# Patient Record
Sex: Female | Born: 1955 | Race: White | Hispanic: No | Smoking: Former smoker
Health system: Southern US, Community
[De-identification: ages and names within clinical notes are randomized; demographics above are authoritative.]

## PROBLEM LIST (undated history)

## (undated) DIAGNOSIS — E785 Hyperlipidemia, unspecified: Secondary | ICD-10-CM

## (undated) DIAGNOSIS — I10 Essential (primary) hypertension: Secondary | ICD-10-CM

## (undated) DIAGNOSIS — N189 Chronic kidney disease, unspecified: Secondary | ICD-10-CM

## (undated) HISTORY — DX: Hyperlipidemia, unspecified: E78.5

## (undated) HISTORY — PX: RIGHT OOPHORECTOMY: SHX2359

## (undated) HISTORY — PX: DILATION AND CURETTAGE OF UTERUS: SHX78

## (undated) HISTORY — DX: Chronic kidney disease, unspecified: N18.9

## (undated) HISTORY — DX: Essential (primary) hypertension: I10

---

## 1999-01-21 ENCOUNTER — Other Ambulatory Visit: Admission: RE | Admit: 1999-01-21 | Discharge: 1999-01-21 | Payer: Self-pay | Admitting: Obstetrics and Gynecology

## 1999-04-02 ENCOUNTER — Encounter: Admission: RE | Admit: 1999-04-02 | Discharge: 1999-04-02 | Payer: Self-pay | Admitting: *Deleted

## 1999-04-02 ENCOUNTER — Encounter: Payer: Self-pay | Admitting: *Deleted

## 2001-01-24 ENCOUNTER — Encounter: Payer: Self-pay | Admitting: Obstetrics and Gynecology

## 2001-01-24 ENCOUNTER — Encounter: Admission: RE | Admit: 2001-01-24 | Discharge: 2001-01-24 | Payer: Self-pay | Admitting: Obstetrics and Gynecology

## 2002-02-14 ENCOUNTER — Encounter: Payer: Self-pay | Admitting: Obstetrics and Gynecology

## 2002-02-14 ENCOUNTER — Encounter: Admission: RE | Admit: 2002-02-14 | Discharge: 2002-02-14 | Payer: Self-pay | Admitting: Obstetrics and Gynecology

## 2003-02-16 ENCOUNTER — Encounter: Payer: Self-pay | Admitting: Obstetrics and Gynecology

## 2003-02-16 ENCOUNTER — Encounter: Admission: RE | Admit: 2003-02-16 | Discharge: 2003-02-16 | Payer: Self-pay | Admitting: Obstetrics and Gynecology

## 2003-06-27 ENCOUNTER — Ambulatory Visit (HOSPITAL_COMMUNITY): Admission: RE | Admit: 2003-06-27 | Discharge: 2003-06-27 | Payer: Self-pay | Admitting: Family Medicine

## 2004-11-19 ENCOUNTER — Ambulatory Visit (HOSPITAL_COMMUNITY): Admission: RE | Admit: 2004-11-19 | Discharge: 2004-11-19 | Payer: Self-pay | Admitting: Obstetrics and Gynecology

## 2005-02-20 IMAGING — CR DG CHEST 2V
2 series · 2 of 2 positions shown · non-contrast
Comparison: None.

CLINICAL DATA: Cough and fever.  Posterior and lower chest pain and shortness of breath.  

 TWO VIEW CHEST, 06/27/03

[view not recorded (1 of 2)]
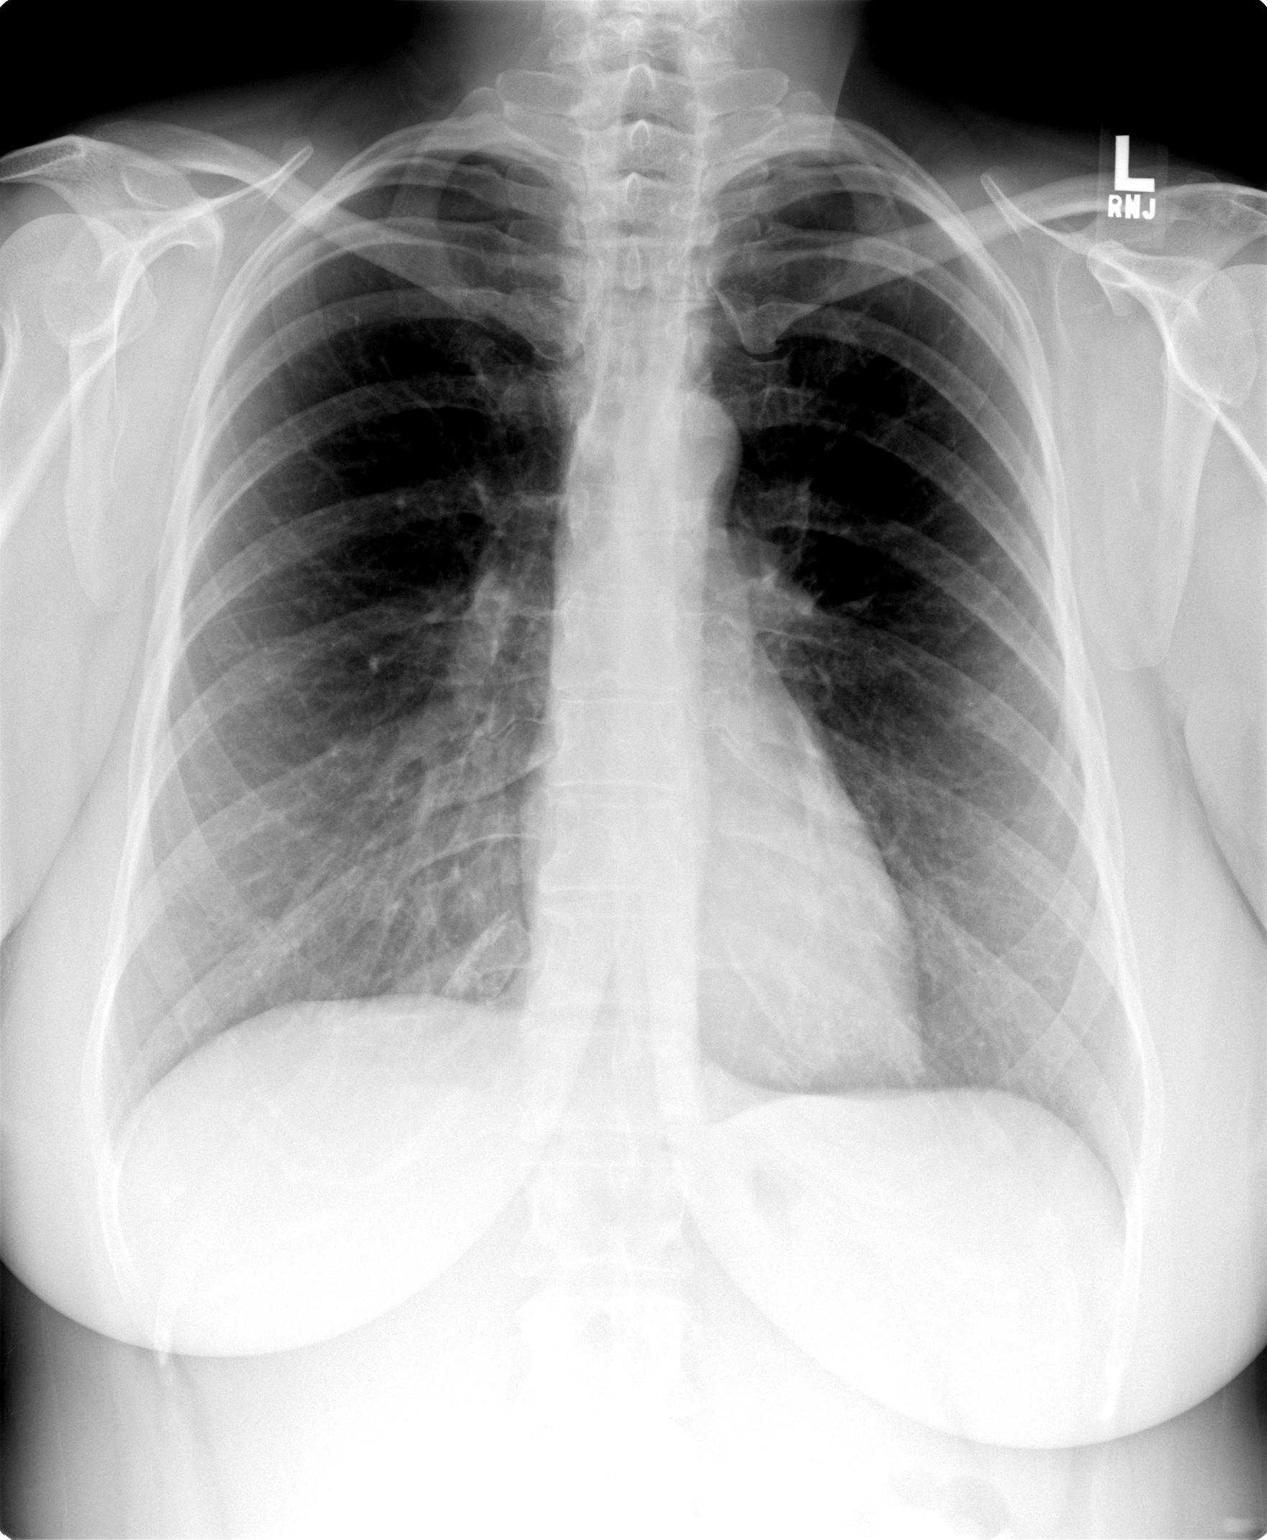

[view not recorded (2 of 2)]
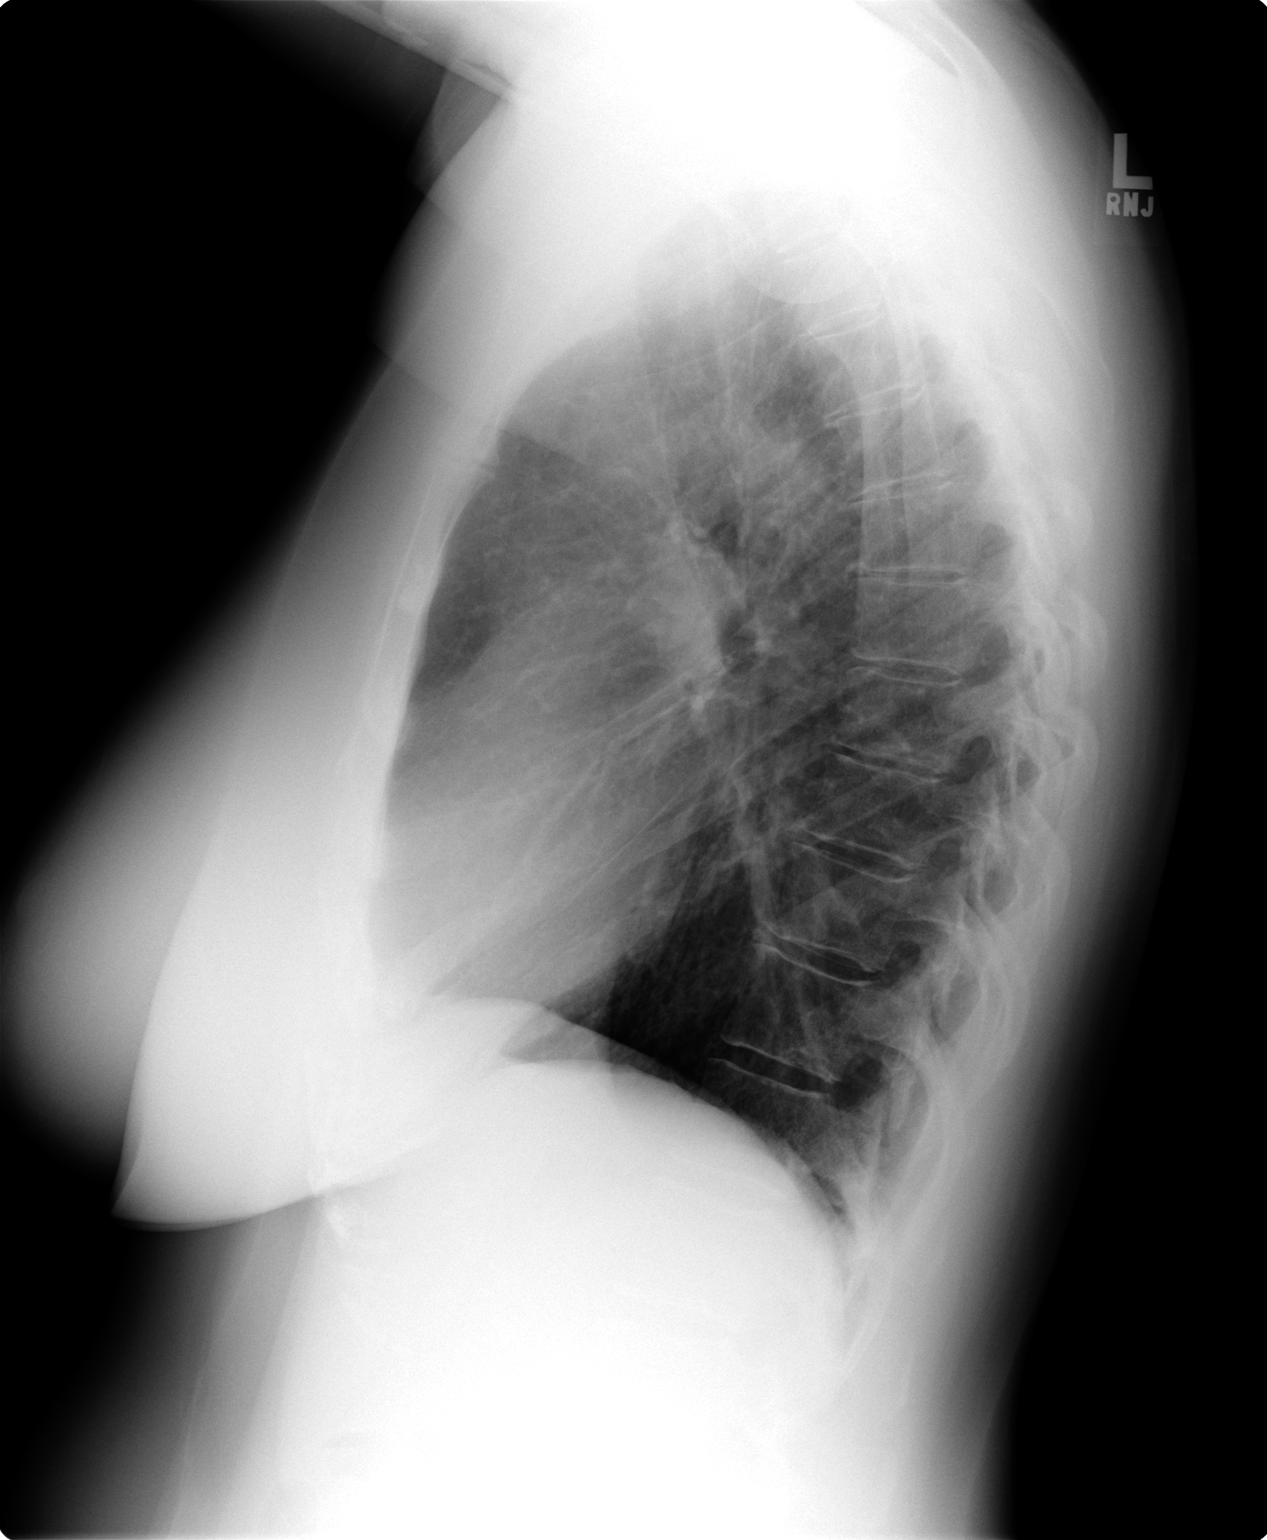

[2 of 2 positions shown; findings below may reference images not displayed]

The heart size and mediastinal contours are normal. The lungs are clear. The visualized skeleton is unremarkable.

 IMPRESSION
 No active disease.

 [REDACTED]

## 2010-08-28 ENCOUNTER — Other Ambulatory Visit: Payer: Self-pay | Admitting: Nurse Practitioner

## 2010-08-28 DIAGNOSIS — Z1231 Encounter for screening mammogram for malignant neoplasm of breast: Secondary | ICD-10-CM

## 2010-09-09 ENCOUNTER — Ambulatory Visit (HOSPITAL_COMMUNITY)
Admission: RE | Admit: 2010-09-09 | Discharge: 2010-09-09 | Disposition: A | Discharge status: Home / Self Care | Payer: 59 | Source: Ambulatory Visit | Attending: Nurse Practitioner | Admitting: Nurse Practitioner

## 2010-09-09 DIAGNOSIS — Z1231 Encounter for screening mammogram for malignant neoplasm of breast: Secondary | ICD-10-CM

## 2012-10-05 ENCOUNTER — Other Ambulatory Visit (HOSPITAL_COMMUNITY): Payer: Self-pay | Admitting: Family Medicine

## 2012-10-05 DIAGNOSIS — Z1231 Encounter for screening mammogram for malignant neoplasm of breast: Secondary | ICD-10-CM

## 2012-11-29 ENCOUNTER — Ambulatory Visit (HOSPITAL_COMMUNITY)
Admission: RE | Admit: 2012-11-29 | Discharge: 2012-11-29 | Disposition: A | Discharge status: Home / Self Care | Payer: BC Managed Care – PPO | Source: Ambulatory Visit | Attending: Family Medicine | Admitting: Family Medicine

## 2012-11-29 DIAGNOSIS — Z1231 Encounter for screening mammogram for malignant neoplasm of breast: Secondary | ICD-10-CM | POA: Insufficient documentation

## 2014-10-04 ENCOUNTER — Other Ambulatory Visit (HOSPITAL_COMMUNITY): Payer: Self-pay | Admitting: Family Medicine

## 2014-10-04 DIAGNOSIS — Z1231 Encounter for screening mammogram for malignant neoplasm of breast: Secondary | ICD-10-CM

## 2014-10-16 ENCOUNTER — Other Ambulatory Visit (HOSPITAL_COMMUNITY): Payer: Self-pay | Admitting: Family Medicine

## 2014-10-16 ENCOUNTER — Ambulatory Visit (HOSPITAL_COMMUNITY)
Admission: RE | Admit: 2014-10-16 | Discharge: 2014-10-16 | Disposition: A | Discharge status: Home / Self Care | Payer: 59 | Source: Ambulatory Visit | Attending: Family Medicine | Admitting: Family Medicine

## 2014-10-16 DIAGNOSIS — Z1231 Encounter for screening mammogram for malignant neoplasm of breast: Secondary | ICD-10-CM

## 2016-05-11 ENCOUNTER — Ambulatory Visit
Admission: RE | Admit: 2016-05-11 | Discharge: 2016-05-11 | Disposition: A | Discharge status: Home / Self Care | Payer: 59 | Source: Ambulatory Visit | Attending: Physician Assistant | Admitting: Physician Assistant

## 2016-05-11 ENCOUNTER — Other Ambulatory Visit: Payer: Self-pay | Admitting: Physician Assistant

## 2016-05-11 DIAGNOSIS — M25561 Pain in right knee: Secondary | ICD-10-CM

## 2018-01-05 IMAGING — CR DG KNEE COMPLETE 4+V*R*
4 series · 4 of 4 positions shown · non-contrast
Comparison: None.

CLINICAL DATA: Chronic pain

EXAM:
RIGHT KNEE - COMPLETE 4+ VIEW

[w knee ap right]
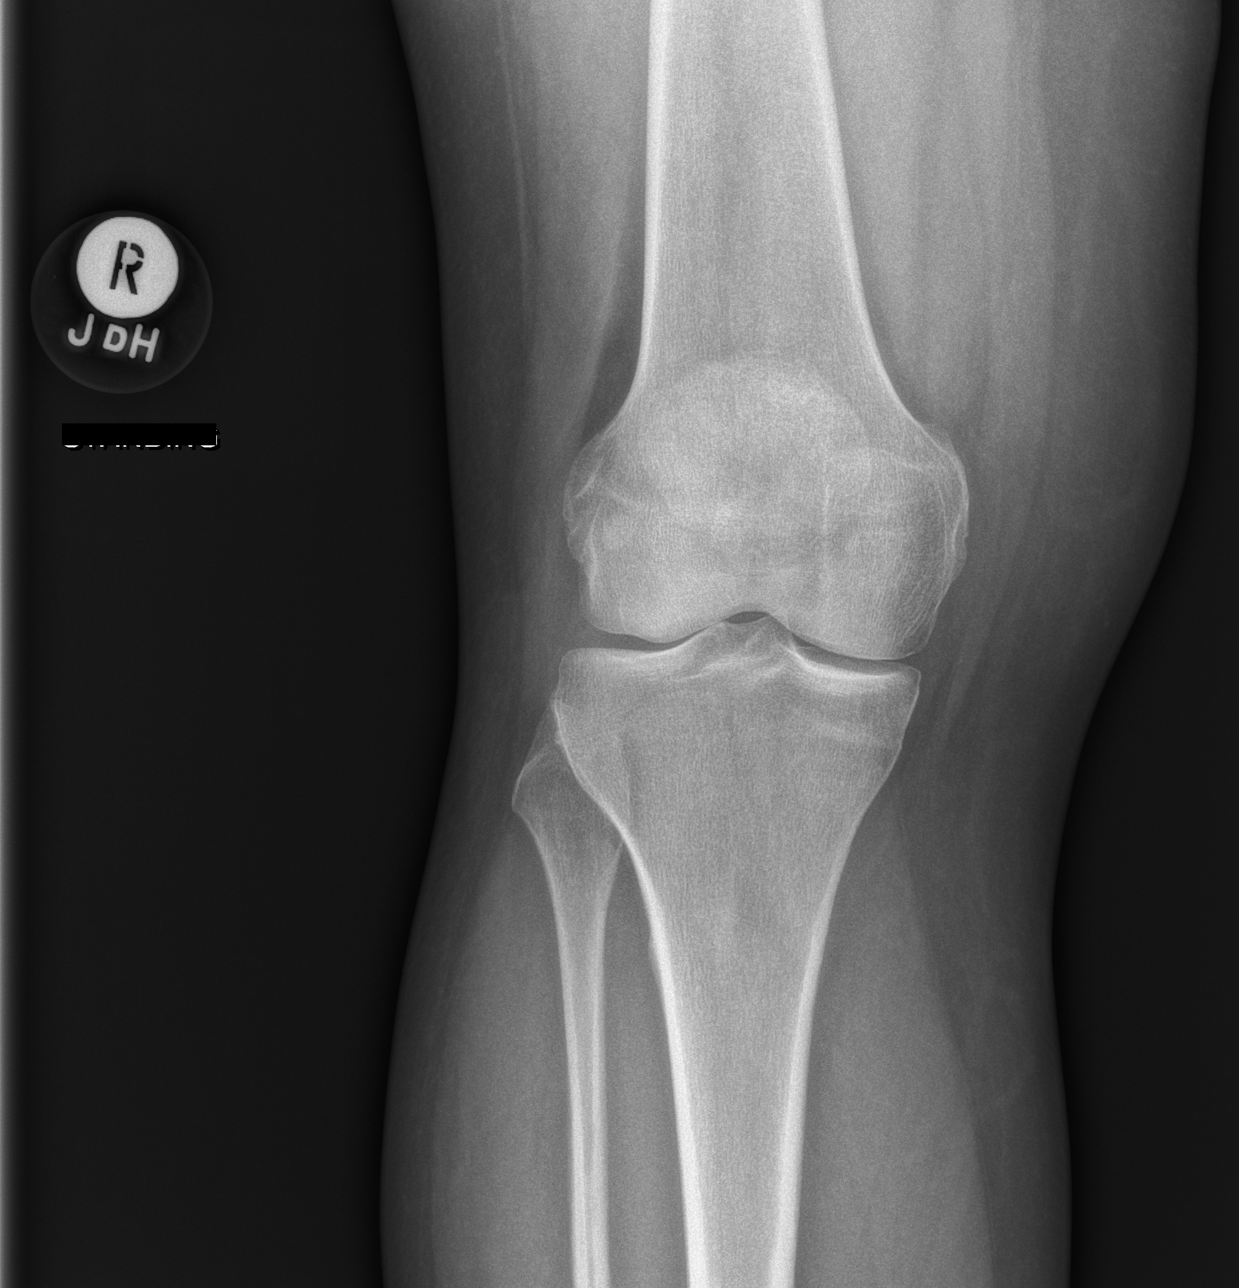

[w knee lat right]
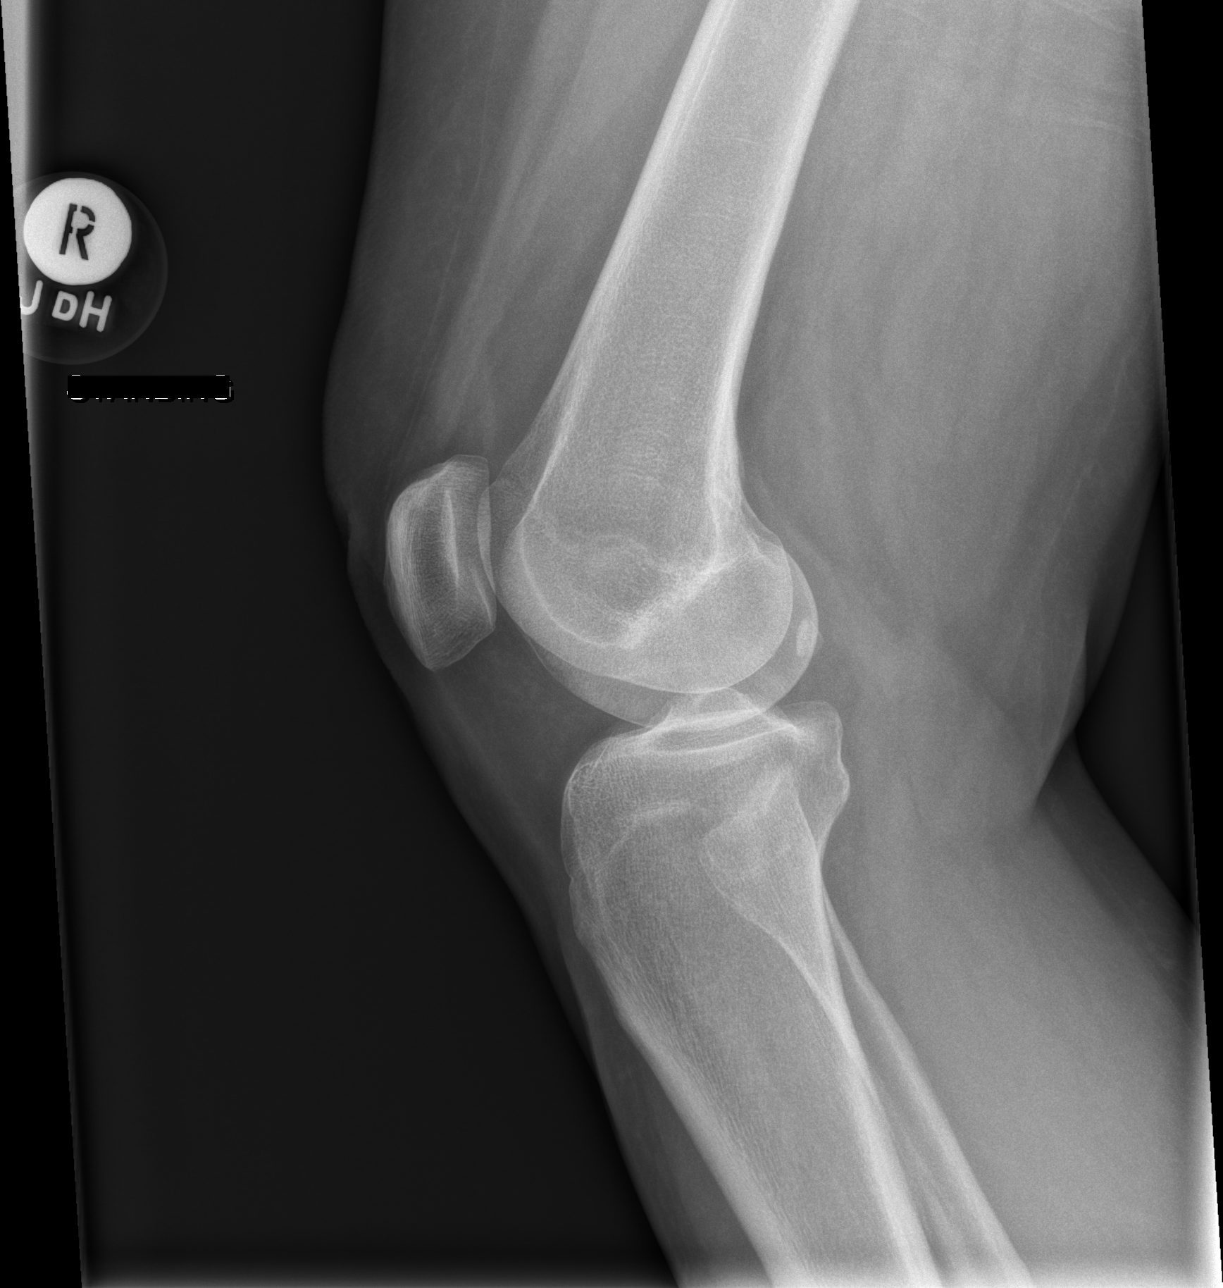

[x knee tunnel right]
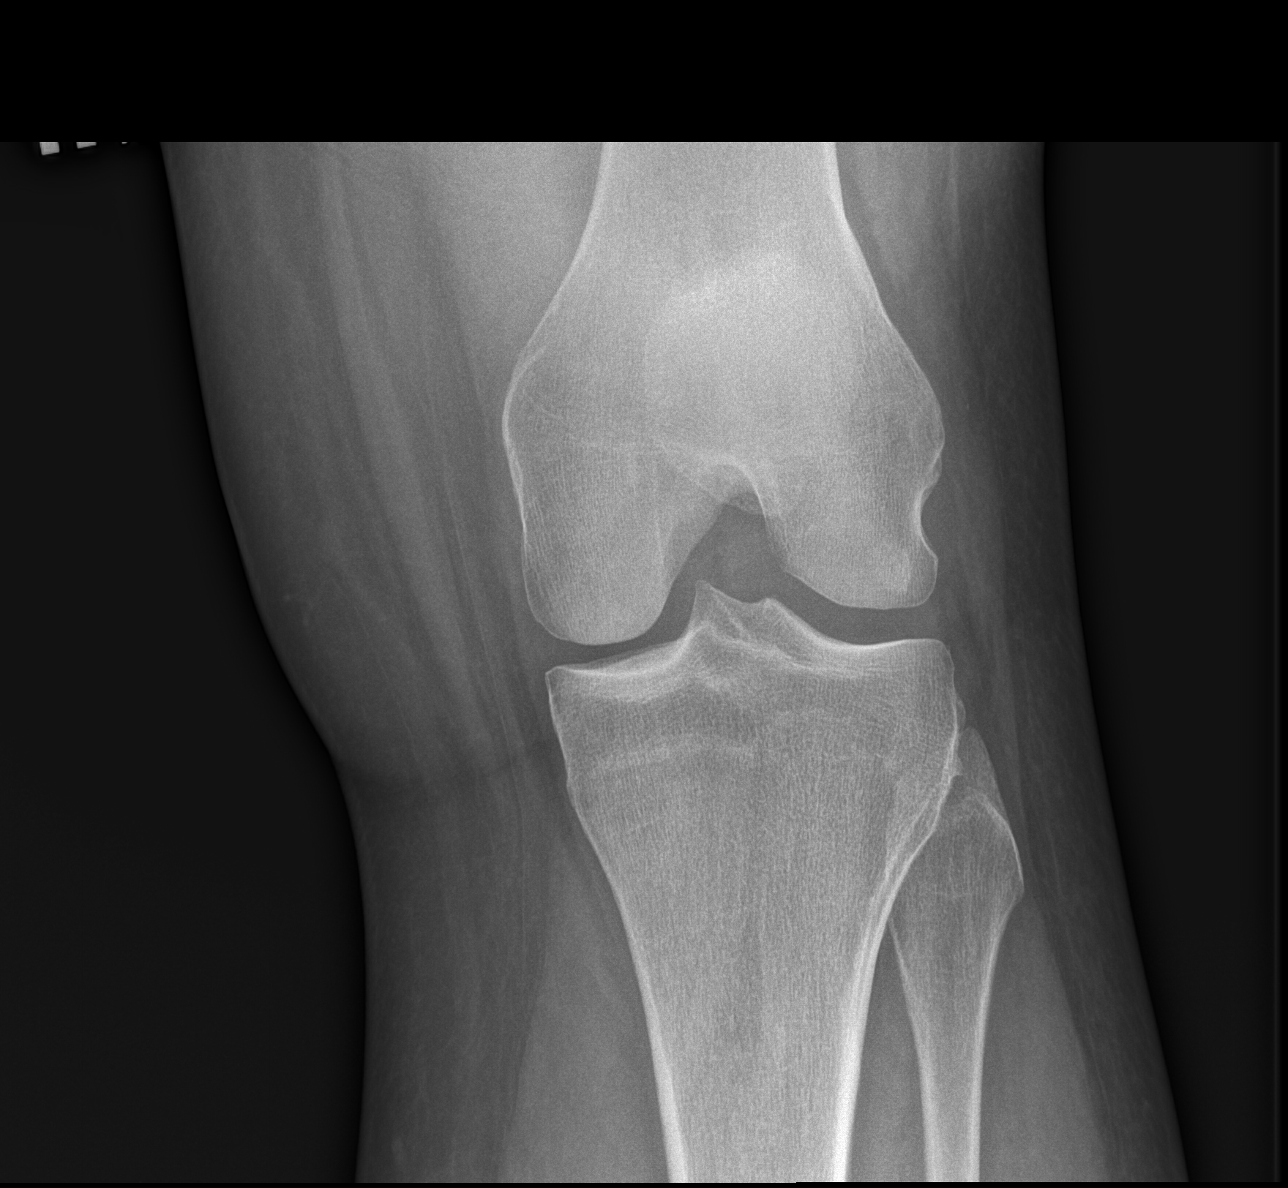

[x knee sunrise right]
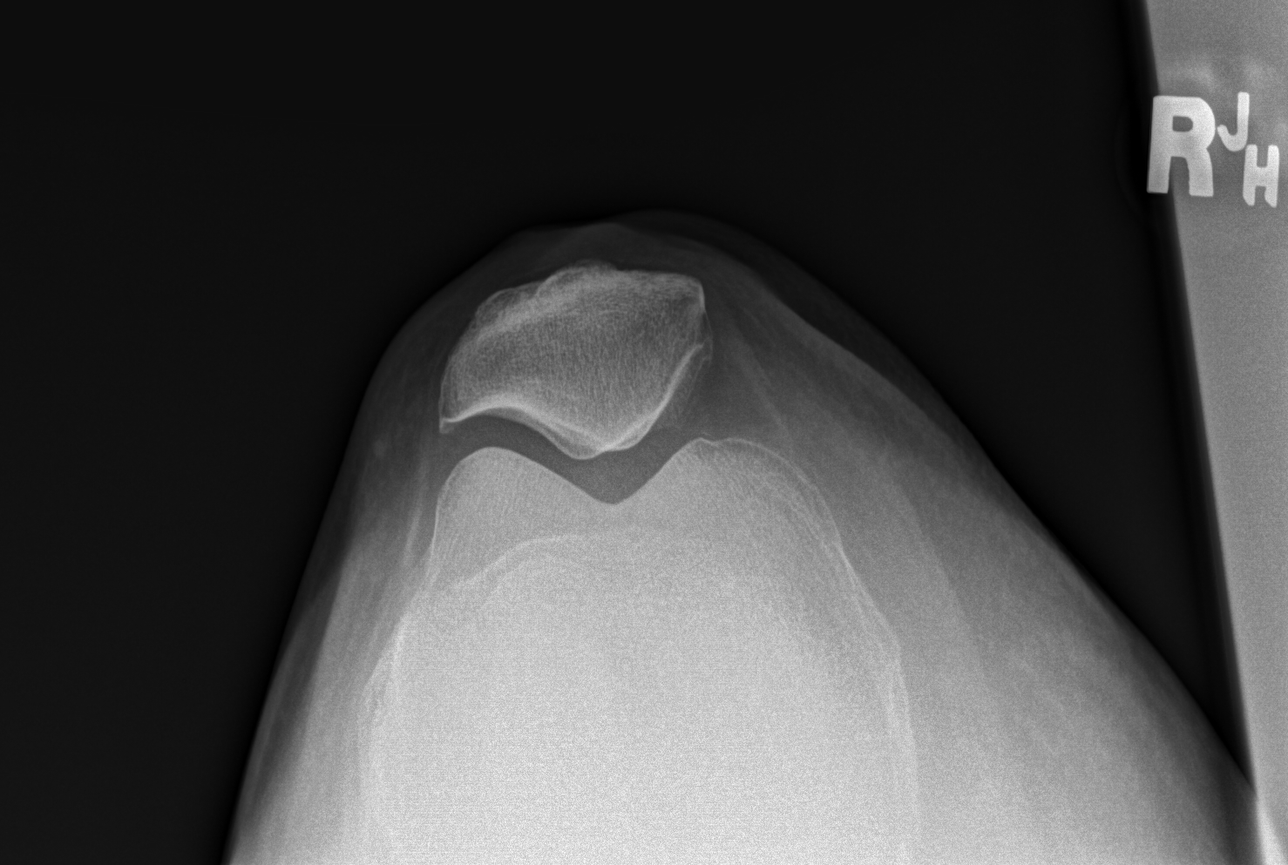

[4 of 4 positions shown; findings below may reference images not displayed]

FINDINGS: Standing frontal, standing lateral, tunnel, and sunrise patellar
images were obtained. There is no fracture or dislocation. No joint
effusion. There is mild narrowing medially. Other joint spaces
appear unremarkable. No erosive change.
IMPRESSION: Mild joint space narrowing medially.  No fracture or joint effusion.

## 2018-12-27 DIAGNOSIS — D693 Immune thrombocytopenic purpura: Secondary | ICD-10-CM | POA: Diagnosis not present

## 2018-12-27 DIAGNOSIS — E7800 Pure hypercholesterolemia, unspecified: Secondary | ICD-10-CM | POA: Diagnosis not present

## 2018-12-27 DIAGNOSIS — E669 Obesity, unspecified: Secondary | ICD-10-CM | POA: Diagnosis not present

## 2018-12-27 DIAGNOSIS — Z0000 Encounter for general adult medical examination without abnormal findings: Secondary | ICD-10-CM | POA: Diagnosis not present

## 2018-12-27 DIAGNOSIS — E559 Vitamin D deficiency, unspecified: Secondary | ICD-10-CM | POA: Diagnosis not present

## 2018-12-27 DIAGNOSIS — I10 Essential (primary) hypertension: Secondary | ICD-10-CM | POA: Diagnosis not present

## 2018-12-27 DIAGNOSIS — E2839 Other primary ovarian failure: Secondary | ICD-10-CM | POA: Diagnosis not present

## 2018-12-27 DIAGNOSIS — Z1283 Encounter for screening for malignant neoplasm of skin: Secondary | ICD-10-CM | POA: Diagnosis not present

## 2018-12-27 DIAGNOSIS — Z1211 Encounter for screening for malignant neoplasm of colon: Secondary | ICD-10-CM | POA: Diagnosis not present

## 2018-12-27 DIAGNOSIS — N183 Chronic kidney disease, stage 3 (moderate): Secondary | ICD-10-CM | POA: Diagnosis not present

## 2018-12-27 DIAGNOSIS — K219 Gastro-esophageal reflux disease without esophagitis: Secondary | ICD-10-CM | POA: Diagnosis not present

## 2019-01-11 DIAGNOSIS — E559 Vitamin D deficiency, unspecified: Secondary | ICD-10-CM | POA: Diagnosis not present

## 2019-01-11 DIAGNOSIS — E7800 Pure hypercholesterolemia, unspecified: Secondary | ICD-10-CM | POA: Diagnosis not present

## 2019-01-11 DIAGNOSIS — Z23 Encounter for immunization: Secondary | ICD-10-CM | POA: Diagnosis not present

## 2019-01-11 DIAGNOSIS — D693 Immune thrombocytopenic purpura: Secondary | ICD-10-CM | POA: Diagnosis not present

## 2019-01-11 DIAGNOSIS — Z0000 Encounter for general adult medical examination without abnormal findings: Secondary | ICD-10-CM | POA: Diagnosis not present

## 2019-01-11 DIAGNOSIS — I10 Essential (primary) hypertension: Secondary | ICD-10-CM | POA: Diagnosis not present

## 2019-01-25 DIAGNOSIS — R29890 Loss of height: Secondary | ICD-10-CM | POA: Diagnosis not present

## 2019-01-25 DIAGNOSIS — M85852 Other specified disorders of bone density and structure, left thigh: Secondary | ICD-10-CM | POA: Diagnosis not present

## 2019-01-25 DIAGNOSIS — Z90710 Acquired absence of both cervix and uterus: Secondary | ICD-10-CM | POA: Diagnosis not present

## 2019-01-25 DIAGNOSIS — Z8262 Family history of osteoporosis: Secondary | ICD-10-CM | POA: Diagnosis not present

## 2019-01-25 DIAGNOSIS — Z780 Asymptomatic menopausal state: Secondary | ICD-10-CM | POA: Diagnosis not present

## 2019-02-28 DIAGNOSIS — Z20828 Contact with and (suspected) exposure to other viral communicable diseases: Secondary | ICD-10-CM | POA: Diagnosis not present

## 2019-04-03 DIAGNOSIS — Z1159 Encounter for screening for other viral diseases: Secondary | ICD-10-CM | POA: Diagnosis not present

## 2019-04-06 DIAGNOSIS — D123 Benign neoplasm of transverse colon: Secondary | ICD-10-CM | POA: Diagnosis not present

## 2019-04-06 DIAGNOSIS — K3189 Other diseases of stomach and duodenum: Secondary | ICD-10-CM | POA: Diagnosis not present

## 2019-04-06 DIAGNOSIS — Z1211 Encounter for screening for malignant neoplasm of colon: Secondary | ICD-10-CM | POA: Diagnosis not present

## 2019-04-06 DIAGNOSIS — K5730 Diverticulosis of large intestine without perforation or abscess without bleeding: Secondary | ICD-10-CM | POA: Diagnosis not present

## 2019-04-06 DIAGNOSIS — K219 Gastro-esophageal reflux disease without esophagitis: Secondary | ICD-10-CM | POA: Diagnosis not present

## 2019-04-06 DIAGNOSIS — K209 Esophagitis, unspecified without bleeding: Secondary | ICD-10-CM | POA: Diagnosis not present

## 2019-04-06 DIAGNOSIS — K311 Adult hypertrophic pyloric stenosis: Secondary | ICD-10-CM | POA: Diagnosis not present

## 2019-04-06 DIAGNOSIS — K317 Polyp of stomach and duodenum: Secondary | ICD-10-CM | POA: Diagnosis not present

## 2019-04-06 DIAGNOSIS — K2930 Chronic superficial gastritis without bleeding: Secondary | ICD-10-CM | POA: Diagnosis not present

## 2019-04-11 DIAGNOSIS — D123 Benign neoplasm of transverse colon: Secondary | ICD-10-CM | POA: Diagnosis not present

## 2019-04-11 DIAGNOSIS — K2930 Chronic superficial gastritis without bleeding: Secondary | ICD-10-CM | POA: Diagnosis not present

## 2019-06-29 DIAGNOSIS — Z1231 Encounter for screening mammogram for malignant neoplasm of breast: Secondary | ICD-10-CM | POA: Diagnosis not present

## 2019-07-03 ENCOUNTER — Ambulatory Visit: Payer: 59

## 2019-07-06 ENCOUNTER — Ambulatory Visit: Payer: Self-pay | Attending: Internal Medicine

## 2019-07-06 DIAGNOSIS — Z23 Encounter for immunization: Secondary | ICD-10-CM

## 2019-07-06 NOTE — Progress Notes (Signed)
   Covid-19 Vaccination Clinic  Name:  Janice Nguyen    MRN: [id number] DOB: [birth year]  07/06/2019  Janice Nguyen was observed post Covid-19 immunization for 15 minutes without incident. She was provided with Vaccine Information Sheet and instruction to access the V-Safe system.   Janice Nguyen was instructed to call 911 with any severe reactions post vaccine: Marland Kitchen Difficulty breathing  . Swelling of face and throat  . A fast heartbeat  . A bad rash all over body  . Dizziness and weakness   Immunizations Administered    Name Date Dose VIS Date Route   Pfizer COVID-19 Vaccine 07/06/2019 10:22 AM 0.3 mL 04/07/2019 Intramuscular   Manufacturer: [place]   Lot: [id number]   [place]: [id number]

## 2019-07-12 DIAGNOSIS — I10 Essential (primary) hypertension: Secondary | ICD-10-CM | POA: Diagnosis not present

## 2019-07-12 DIAGNOSIS — F321 Major depressive disorder, single episode, moderate: Secondary | ICD-10-CM | POA: Diagnosis not present

## 2019-07-12 DIAGNOSIS — E7800 Pure hypercholesterolemia, unspecified: Secondary | ICD-10-CM | POA: Diagnosis not present

## 2019-07-12 DIAGNOSIS — J309 Allergic rhinitis, unspecified: Secondary | ICD-10-CM | POA: Diagnosis not present

## 2019-07-12 DIAGNOSIS — N1831 Chronic kidney disease, stage 3a: Secondary | ICD-10-CM | POA: Diagnosis not present

## 2019-07-12 DIAGNOSIS — D693 Immune thrombocytopenic purpura: Secondary | ICD-10-CM | POA: Diagnosis not present

## 2019-07-12 DIAGNOSIS — K219 Gastro-esophageal reflux disease without esophagitis: Secondary | ICD-10-CM | POA: Diagnosis not present

## 2019-07-12 DIAGNOSIS — E559 Vitamin D deficiency, unspecified: Secondary | ICD-10-CM | POA: Diagnosis not present

## 2019-07-12 DIAGNOSIS — E440 Moderate protein-calorie malnutrition: Secondary | ICD-10-CM | POA: Diagnosis not present

## 2019-07-24 DIAGNOSIS — L821 Other seborrheic keratosis: Secondary | ICD-10-CM | POA: Diagnosis not present

## 2019-07-24 DIAGNOSIS — L738 Other specified follicular disorders: Secondary | ICD-10-CM | POA: Diagnosis not present

## 2019-07-24 DIAGNOSIS — D2362 Other benign neoplasm of skin of left upper limb, including shoulder: Secondary | ICD-10-CM | POA: Diagnosis not present

## 2019-07-24 DIAGNOSIS — L718 Other rosacea: Secondary | ICD-10-CM | POA: Diagnosis not present

## 2019-07-24 DIAGNOSIS — L918 Other hypertrophic disorders of the skin: Secondary | ICD-10-CM | POA: Diagnosis not present

## 2019-07-31 ENCOUNTER — Ambulatory Visit: Payer: PPO | Attending: Internal Medicine

## 2019-07-31 DIAGNOSIS — Z23 Encounter for immunization: Secondary | ICD-10-CM

## 2019-07-31 NOTE — Progress Notes (Signed)
   Covid-19 Vaccination Clinic  Name:  MAKENZI SCOTLAND    MRN: [id number] DOB: 1956-04-08  07/31/2019  Ms. Mumme was observed post Covid-19 immunization for 15 minutes without incident. She was provided with Vaccine Information Sheet and instruction to access the V-Safe system.   Ms. Dach was instructed to call 911 with any severe reactions post vaccine: Marland Kitchen Difficulty breathing  . Swelling of face and throat  . A fast heartbeat  . A bad rash all over body  . Dizziness and weakness   Immunizations Administered    Name Date Dose VIS Date Route   Pfizer COVID-19 Vaccine 07/31/2019 10:45 AM 0.3 mL 04/07/2019 Intramuscular   Manufacturer: [employer]   Lot: [id number]   [place]: [id number]

## 2019-08-10 DIAGNOSIS — F321 Major depressive disorder, single episode, moderate: Secondary | ICD-10-CM | POA: Diagnosis not present

## 2019-10-17 DIAGNOSIS — H3562 Retinal hemorrhage, left eye: Secondary | ICD-10-CM | POA: Diagnosis not present

## 2019-10-18 DIAGNOSIS — F321 Major depressive disorder, single episode, moderate: Secondary | ICD-10-CM | POA: Diagnosis not present

## 2020-01-18 DIAGNOSIS — K219 Gastro-esophageal reflux disease without esophagitis: Secondary | ICD-10-CM | POA: Diagnosis not present

## 2020-01-18 DIAGNOSIS — I10 Essential (primary) hypertension: Secondary | ICD-10-CM | POA: Diagnosis not present

## 2020-01-18 DIAGNOSIS — M25551 Pain in right hip: Secondary | ICD-10-CM | POA: Diagnosis not present

## 2020-01-18 DIAGNOSIS — F321 Major depressive disorder, single episode, moderate: Secondary | ICD-10-CM | POA: Diagnosis not present

## 2020-01-18 DIAGNOSIS — E7800 Pure hypercholesterolemia, unspecified: Secondary | ICD-10-CM | POA: Diagnosis not present

## 2020-01-18 DIAGNOSIS — D693 Immune thrombocytopenic purpura: Secondary | ICD-10-CM | POA: Diagnosis not present

## 2020-01-18 DIAGNOSIS — F419 Anxiety disorder, unspecified: Secondary | ICD-10-CM | POA: Diagnosis not present

## 2020-01-18 DIAGNOSIS — Z0000 Encounter for general adult medical examination without abnormal findings: Secondary | ICD-10-CM | POA: Diagnosis not present

## 2020-01-18 DIAGNOSIS — Z1389 Encounter for screening for other disorder: Secondary | ICD-10-CM | POA: Diagnosis not present

## 2020-01-18 DIAGNOSIS — N1831 Chronic kidney disease, stage 3a: Secondary | ICD-10-CM | POA: Diagnosis not present

## 2020-01-18 DIAGNOSIS — Z23 Encounter for immunization: Secondary | ICD-10-CM | POA: Diagnosis not present

## 2020-01-18 DIAGNOSIS — E559 Vitamin D deficiency, unspecified: Secondary | ICD-10-CM | POA: Diagnosis not present

## 2020-01-19 DIAGNOSIS — H3562 Retinal hemorrhage, left eye: Secondary | ICD-10-CM | POA: Diagnosis not present

## 2020-02-05 DIAGNOSIS — F321 Major depressive disorder, single episode, moderate: Secondary | ICD-10-CM | POA: Diagnosis not present

## 2020-02-05 DIAGNOSIS — I10 Essential (primary) hypertension: Secondary | ICD-10-CM | POA: Diagnosis not present

## 2020-02-05 DIAGNOSIS — E7800 Pure hypercholesterolemia, unspecified: Secondary | ICD-10-CM | POA: Diagnosis not present

## 2020-02-05 DIAGNOSIS — N1831 Chronic kidney disease, stage 3a: Secondary | ICD-10-CM | POA: Diagnosis not present

## 2020-02-05 DIAGNOSIS — K219 Gastro-esophageal reflux disease without esophagitis: Secondary | ICD-10-CM | POA: Diagnosis not present

## 2020-02-05 DIAGNOSIS — N183 Chronic kidney disease, stage 3 unspecified: Secondary | ICD-10-CM | POA: Diagnosis not present

## 2020-02-07 DIAGNOSIS — M25551 Pain in right hip: Secondary | ICD-10-CM | POA: Diagnosis not present

## 2020-04-24 DIAGNOSIS — I10 Essential (primary) hypertension: Secondary | ICD-10-CM | POA: Diagnosis not present

## 2020-04-24 DIAGNOSIS — N183 Chronic kidney disease, stage 3 unspecified: Secondary | ICD-10-CM | POA: Diagnosis not present

## 2020-04-24 DIAGNOSIS — E7800 Pure hypercholesterolemia, unspecified: Secondary | ICD-10-CM | POA: Diagnosis not present

## 2020-04-24 DIAGNOSIS — F321 Major depressive disorder, single episode, moderate: Secondary | ICD-10-CM | POA: Diagnosis not present

## 2020-04-24 DIAGNOSIS — N1831 Chronic kidney disease, stage 3a: Secondary | ICD-10-CM | POA: Diagnosis not present

## 2020-04-24 DIAGNOSIS — K219 Gastro-esophageal reflux disease without esophagitis: Secondary | ICD-10-CM | POA: Diagnosis not present

## 2020-05-27 DIAGNOSIS — N183 Chronic kidney disease, stage 3 unspecified: Secondary | ICD-10-CM | POA: Diagnosis not present

## 2020-05-27 DIAGNOSIS — N1831 Chronic kidney disease, stage 3a: Secondary | ICD-10-CM | POA: Diagnosis not present

## 2020-05-27 DIAGNOSIS — I10 Essential (primary) hypertension: Secondary | ICD-10-CM | POA: Diagnosis not present

## 2020-05-27 DIAGNOSIS — F321 Major depressive disorder, single episode, moderate: Secondary | ICD-10-CM | POA: Diagnosis not present

## 2020-05-27 DIAGNOSIS — E7800 Pure hypercholesterolemia, unspecified: Secondary | ICD-10-CM | POA: Diagnosis not present

## 2020-05-27 DIAGNOSIS — K219 Gastro-esophageal reflux disease without esophagitis: Secondary | ICD-10-CM | POA: Diagnosis not present

## 2020-07-17 DIAGNOSIS — E559 Vitamin D deficiency, unspecified: Secondary | ICD-10-CM | POA: Diagnosis not present

## 2020-07-17 DIAGNOSIS — K219 Gastro-esophageal reflux disease without esophagitis: Secondary | ICD-10-CM | POA: Diagnosis not present

## 2020-07-17 DIAGNOSIS — J309 Allergic rhinitis, unspecified: Secondary | ICD-10-CM | POA: Diagnosis not present

## 2020-07-17 DIAGNOSIS — F321 Major depressive disorder, single episode, moderate: Secondary | ICD-10-CM | POA: Diagnosis not present

## 2020-07-17 DIAGNOSIS — F419 Anxiety disorder, unspecified: Secondary | ICD-10-CM | POA: Diagnosis not present

## 2020-07-17 DIAGNOSIS — N183 Chronic kidney disease, stage 3 unspecified: Secondary | ICD-10-CM | POA: Diagnosis not present

## 2020-07-17 DIAGNOSIS — I10 Essential (primary) hypertension: Secondary | ICD-10-CM | POA: Diagnosis not present

## 2020-07-17 DIAGNOSIS — E7800 Pure hypercholesterolemia, unspecified: Secondary | ICD-10-CM | POA: Diagnosis not present

## 2020-07-17 DIAGNOSIS — D693 Immune thrombocytopenic purpura: Secondary | ICD-10-CM | POA: Diagnosis not present

## 2020-07-23 DIAGNOSIS — D1801 Hemangioma of skin and subcutaneous tissue: Secondary | ICD-10-CM | POA: Diagnosis not present

## 2020-07-23 DIAGNOSIS — Z1231 Encounter for screening mammogram for malignant neoplasm of breast: Secondary | ICD-10-CM | POA: Diagnosis not present

## 2020-07-23 DIAGNOSIS — D2362 Other benign neoplasm of skin of left upper limb, including shoulder: Secondary | ICD-10-CM | POA: Diagnosis not present

## 2020-07-23 DIAGNOSIS — L820 Inflamed seborrheic keratosis: Secondary | ICD-10-CM | POA: Diagnosis not present

## 2020-07-23 DIAGNOSIS — L918 Other hypertrophic disorders of the skin: Secondary | ICD-10-CM | POA: Diagnosis not present

## 2020-07-23 DIAGNOSIS — L821 Other seborrheic keratosis: Secondary | ICD-10-CM | POA: Diagnosis not present

## 2020-07-23 DIAGNOSIS — L570 Actinic keratosis: Secondary | ICD-10-CM | POA: Diagnosis not present

## 2020-08-20 DIAGNOSIS — N183 Chronic kidney disease, stage 3 unspecified: Secondary | ICD-10-CM | POA: Diagnosis not present

## 2020-08-20 DIAGNOSIS — K219 Gastro-esophageal reflux disease without esophagitis: Secondary | ICD-10-CM | POA: Diagnosis not present

## 2020-08-20 DIAGNOSIS — N1831 Chronic kidney disease, stage 3a: Secondary | ICD-10-CM | POA: Diagnosis not present

## 2020-08-20 DIAGNOSIS — I10 Essential (primary) hypertension: Secondary | ICD-10-CM | POA: Diagnosis not present

## 2020-08-20 DIAGNOSIS — F321 Major depressive disorder, single episode, moderate: Secondary | ICD-10-CM | POA: Diagnosis not present

## 2020-08-20 DIAGNOSIS — E7800 Pure hypercholesterolemia, unspecified: Secondary | ICD-10-CM | POA: Diagnosis not present

## 2020-09-05 DIAGNOSIS — L578 Other skin changes due to chronic exposure to nonionizing radiation: Secondary | ICD-10-CM | POA: Diagnosis not present

## 2020-09-05 DIAGNOSIS — L738 Other specified follicular disorders: Secondary | ICD-10-CM | POA: Diagnosis not present

## 2020-09-05 DIAGNOSIS — L820 Inflamed seborrheic keratosis: Secondary | ICD-10-CM | POA: Diagnosis not present

## 2020-10-17 DIAGNOSIS — F321 Major depressive disorder, single episode, moderate: Secondary | ICD-10-CM | POA: Diagnosis not present

## 2020-10-21 DIAGNOSIS — F321 Major depressive disorder, single episode, moderate: Secondary | ICD-10-CM | POA: Diagnosis not present

## 2020-10-21 DIAGNOSIS — K219 Gastro-esophageal reflux disease without esophagitis: Secondary | ICD-10-CM | POA: Diagnosis not present

## 2020-10-21 DIAGNOSIS — I10 Essential (primary) hypertension: Secondary | ICD-10-CM | POA: Diagnosis not present

## 2020-10-21 DIAGNOSIS — N183 Chronic kidney disease, stage 3 unspecified: Secondary | ICD-10-CM | POA: Diagnosis not present

## 2020-10-21 DIAGNOSIS — E7800 Pure hypercholesterolemia, unspecified: Secondary | ICD-10-CM | POA: Diagnosis not present

## 2020-11-28 DIAGNOSIS — F321 Major depressive disorder, single episode, moderate: Secondary | ICD-10-CM | POA: Diagnosis not present

## 2020-11-28 DIAGNOSIS — N183 Chronic kidney disease, stage 3 unspecified: Secondary | ICD-10-CM | POA: Diagnosis not present

## 2020-11-28 DIAGNOSIS — I10 Essential (primary) hypertension: Secondary | ICD-10-CM | POA: Diagnosis not present

## 2020-11-28 DIAGNOSIS — N1831 Chronic kidney disease, stage 3a: Secondary | ICD-10-CM | POA: Diagnosis not present

## 2020-11-28 DIAGNOSIS — E7800 Pure hypercholesterolemia, unspecified: Secondary | ICD-10-CM | POA: Diagnosis not present

## 2020-11-28 DIAGNOSIS — K219 Gastro-esophageal reflux disease without esophagitis: Secondary | ICD-10-CM | POA: Diagnosis not present

## 2021-01-09 DIAGNOSIS — E7800 Pure hypercholesterolemia, unspecified: Secondary | ICD-10-CM | POA: Diagnosis not present

## 2021-01-09 DIAGNOSIS — K219 Gastro-esophageal reflux disease without esophagitis: Secondary | ICD-10-CM | POA: Diagnosis not present

## 2021-01-09 DIAGNOSIS — I10 Essential (primary) hypertension: Secondary | ICD-10-CM | POA: Diagnosis not present

## 2021-01-09 DIAGNOSIS — F321 Major depressive disorder, single episode, moderate: Secondary | ICD-10-CM | POA: Diagnosis not present

## 2021-01-09 DIAGNOSIS — N1831 Chronic kidney disease, stage 3a: Secondary | ICD-10-CM | POA: Diagnosis not present

## 2021-01-29 DIAGNOSIS — U071 COVID-19: Secondary | ICD-10-CM | POA: Diagnosis not present

## 2021-02-07 DIAGNOSIS — I10 Essential (primary) hypertension: Secondary | ICD-10-CM | POA: Diagnosis not present

## 2021-02-07 DIAGNOSIS — D693 Immune thrombocytopenic purpura: Secondary | ICD-10-CM | POA: Diagnosis not present

## 2021-02-07 DIAGNOSIS — N1831 Chronic kidney disease, stage 3a: Secondary | ICD-10-CM | POA: Diagnosis not present

## 2021-02-07 DIAGNOSIS — F321 Major depressive disorder, single episode, moderate: Secondary | ICD-10-CM | POA: Diagnosis not present

## 2021-02-07 DIAGNOSIS — J309 Allergic rhinitis, unspecified: Secondary | ICD-10-CM | POA: Diagnosis not present

## 2021-02-07 DIAGNOSIS — E559 Vitamin D deficiency, unspecified: Secondary | ICD-10-CM | POA: Diagnosis not present

## 2021-02-07 DIAGNOSIS — K219 Gastro-esophageal reflux disease without esophagitis: Secondary | ICD-10-CM | POA: Diagnosis not present

## 2021-02-07 DIAGNOSIS — E7800 Pure hypercholesterolemia, unspecified: Secondary | ICD-10-CM | POA: Diagnosis not present

## 2021-02-07 DIAGNOSIS — F419 Anxiety disorder, unspecified: Secondary | ICD-10-CM | POA: Diagnosis not present

## 2021-04-10 DIAGNOSIS — I10 Essential (primary) hypertension: Secondary | ICD-10-CM | POA: Diagnosis not present

## 2021-04-10 DIAGNOSIS — N183 Chronic kidney disease, stage 3 unspecified: Secondary | ICD-10-CM | POA: Diagnosis not present

## 2021-04-10 DIAGNOSIS — F321 Major depressive disorder, single episode, moderate: Secondary | ICD-10-CM | POA: Diagnosis not present

## 2021-04-10 DIAGNOSIS — E7800 Pure hypercholesterolemia, unspecified: Secondary | ICD-10-CM | POA: Diagnosis not present

## 2021-04-10 DIAGNOSIS — N1831 Chronic kidney disease, stage 3a: Secondary | ICD-10-CM | POA: Diagnosis not present

## 2021-04-10 DIAGNOSIS — K219 Gastro-esophageal reflux disease without esophagitis: Secondary | ICD-10-CM | POA: Diagnosis not present

## 2021-04-17 DIAGNOSIS — L918 Other hypertrophic disorders of the skin: Secondary | ICD-10-CM | POA: Diagnosis not present

## 2021-04-17 DIAGNOSIS — D2362 Other benign neoplasm of skin of left upper limb, including shoulder: Secondary | ICD-10-CM | POA: Diagnosis not present

## 2021-04-17 DIAGNOSIS — L821 Other seborrheic keratosis: Secondary | ICD-10-CM | POA: Diagnosis not present

## 2021-04-17 DIAGNOSIS — L570 Actinic keratosis: Secondary | ICD-10-CM | POA: Diagnosis not present

## 2021-05-20 DIAGNOSIS — E7800 Pure hypercholesterolemia, unspecified: Secondary | ICD-10-CM | POA: Diagnosis not present

## 2021-06-04 DIAGNOSIS — J019 Acute sinusitis, unspecified: Secondary | ICD-10-CM | POA: Diagnosis not present

## 2021-06-19 DIAGNOSIS — J309 Allergic rhinitis, unspecified: Secondary | ICD-10-CM | POA: Diagnosis not present

## 2021-07-03 DIAGNOSIS — R350 Frequency of micturition: Secondary | ICD-10-CM | POA: Diagnosis not present

## 2021-07-03 DIAGNOSIS — N3000 Acute cystitis without hematuria: Secondary | ICD-10-CM | POA: Diagnosis not present

## 2021-07-04 DIAGNOSIS — E7800 Pure hypercholesterolemia, unspecified: Secondary | ICD-10-CM | POA: Diagnosis not present

## 2021-07-04 DIAGNOSIS — F321 Major depressive disorder, single episode, moderate: Secondary | ICD-10-CM | POA: Diagnosis not present

## 2021-07-04 DIAGNOSIS — I10 Essential (primary) hypertension: Secondary | ICD-10-CM | POA: Diagnosis not present

## 2021-08-05 DIAGNOSIS — Z1231 Encounter for screening mammogram for malignant neoplasm of breast: Secondary | ICD-10-CM | POA: Diagnosis not present

## 2021-08-15 DIAGNOSIS — Z780 Asymptomatic menopausal state: Secondary | ICD-10-CM | POA: Diagnosis not present

## 2021-09-24 DIAGNOSIS — Z0000 Encounter for general adult medical examination without abnormal findings: Secondary | ICD-10-CM | POA: Diagnosis not present

## 2021-09-24 DIAGNOSIS — F419 Anxiety disorder, unspecified: Secondary | ICD-10-CM | POA: Diagnosis not present

## 2021-09-24 DIAGNOSIS — E559 Vitamin D deficiency, unspecified: Secondary | ICD-10-CM | POA: Diagnosis not present

## 2021-09-24 DIAGNOSIS — N1831 Chronic kidney disease, stage 3a: Secondary | ICD-10-CM | POA: Diagnosis not present

## 2021-09-24 DIAGNOSIS — K219 Gastro-esophageal reflux disease without esophagitis: Secondary | ICD-10-CM | POA: Diagnosis not present

## 2021-09-24 DIAGNOSIS — I10 Essential (primary) hypertension: Secondary | ICD-10-CM | POA: Diagnosis not present

## 2021-09-24 DIAGNOSIS — E7800 Pure hypercholesterolemia, unspecified: Secondary | ICD-10-CM | POA: Diagnosis not present

## 2021-09-24 DIAGNOSIS — F321 Major depressive disorder, single episode, moderate: Secondary | ICD-10-CM | POA: Diagnosis not present

## 2021-09-24 DIAGNOSIS — D693 Immune thrombocytopenic purpura: Secondary | ICD-10-CM | POA: Diagnosis not present

## 2022-03-03 DIAGNOSIS — J019 Acute sinusitis, unspecified: Secondary | ICD-10-CM | POA: Diagnosis not present

## 2022-03-03 DIAGNOSIS — J069 Acute upper respiratory infection, unspecified: Secondary | ICD-10-CM | POA: Diagnosis not present

## 2022-03-03 DIAGNOSIS — B9689 Other specified bacterial agents as the cause of diseases classified elsewhere: Secondary | ICD-10-CM | POA: Diagnosis not present

## 2022-03-03 DIAGNOSIS — R051 Acute cough: Secondary | ICD-10-CM | POA: Diagnosis not present

## 2022-04-06 DIAGNOSIS — N390 Urinary tract infection, site not specified: Secondary | ICD-10-CM | POA: Diagnosis not present

## 2022-04-06 DIAGNOSIS — R399 Unspecified symptoms and signs involving the genitourinary system: Secondary | ICD-10-CM | POA: Diagnosis not present

## 2022-05-21 DIAGNOSIS — D1801 Hemangioma of skin and subcutaneous tissue: Secondary | ICD-10-CM | POA: Diagnosis not present

## 2022-05-21 DIAGNOSIS — D235 Other benign neoplasm of skin of trunk: Secondary | ICD-10-CM | POA: Diagnosis not present

## 2022-05-21 DIAGNOSIS — L918 Other hypertrophic disorders of the skin: Secondary | ICD-10-CM | POA: Diagnosis not present

## 2022-05-21 DIAGNOSIS — L821 Other seborrheic keratosis: Secondary | ICD-10-CM | POA: Diagnosis not present

## 2022-05-21 DIAGNOSIS — L570 Actinic keratosis: Secondary | ICD-10-CM | POA: Diagnosis not present

## 2022-05-21 DIAGNOSIS — D171 Benign lipomatous neoplasm of skin and subcutaneous tissue of trunk: Secondary | ICD-10-CM | POA: Diagnosis not present

## 2022-08-11 DIAGNOSIS — Z1231 Encounter for screening mammogram for malignant neoplasm of breast: Secondary | ICD-10-CM | POA: Diagnosis not present

## 2022-10-09 DIAGNOSIS — F411 Generalized anxiety disorder: Secondary | ICD-10-CM | POA: Diagnosis not present

## 2022-10-09 DIAGNOSIS — Z9181 History of falling: Secondary | ICD-10-CM | POA: Diagnosis not present

## 2022-10-09 DIAGNOSIS — I10 Essential (primary) hypertension: Secondary | ICD-10-CM | POA: Diagnosis not present

## 2022-10-09 DIAGNOSIS — E7800 Pure hypercholesterolemia, unspecified: Secondary | ICD-10-CM | POA: Diagnosis not present

## 2022-10-09 DIAGNOSIS — K219 Gastro-esophageal reflux disease without esophagitis: Secondary | ICD-10-CM | POA: Diagnosis not present

## 2022-10-09 DIAGNOSIS — N1831 Chronic kidney disease, stage 3a: Secondary | ICD-10-CM | POA: Diagnosis not present

## 2022-10-09 DIAGNOSIS — E559 Vitamin D deficiency, unspecified: Secondary | ICD-10-CM | POA: Diagnosis not present

## 2022-10-09 DIAGNOSIS — F324 Major depressive disorder, single episode, in partial remission: Secondary | ICD-10-CM | POA: Diagnosis not present

## 2022-10-09 DIAGNOSIS — J309 Allergic rhinitis, unspecified: Secondary | ICD-10-CM | POA: Diagnosis not present

## 2022-10-09 DIAGNOSIS — D693 Immune thrombocytopenic purpura: Secondary | ICD-10-CM | POA: Diagnosis not present

## 2022-10-09 DIAGNOSIS — Z0000 Encounter for general adult medical examination without abnormal findings: Secondary | ICD-10-CM | POA: Diagnosis not present

## 2023-03-09 DIAGNOSIS — N3000 Acute cystitis without hematuria: Secondary | ICD-10-CM | POA: Diagnosis not present

## 2023-04-07 DIAGNOSIS — E559 Vitamin D deficiency, unspecified: Secondary | ICD-10-CM | POA: Diagnosis not present

## 2023-04-07 DIAGNOSIS — I10 Essential (primary) hypertension: Secondary | ICD-10-CM | POA: Diagnosis not present

## 2023-04-07 DIAGNOSIS — N1831 Chronic kidney disease, stage 3a: Secondary | ICD-10-CM | POA: Diagnosis not present

## 2023-04-07 DIAGNOSIS — E7800 Pure hypercholesterolemia, unspecified: Secondary | ICD-10-CM | POA: Diagnosis not present

## 2023-04-07 DIAGNOSIS — F324 Major depressive disorder, single episode, in partial remission: Secondary | ICD-10-CM | POA: Diagnosis not present

## 2023-04-07 DIAGNOSIS — F419 Anxiety disorder, unspecified: Secondary | ICD-10-CM | POA: Diagnosis not present

## 2023-04-07 DIAGNOSIS — D693 Immune thrombocytopenic purpura: Secondary | ICD-10-CM | POA: Diagnosis not present

## 2023-04-07 DIAGNOSIS — K219 Gastro-esophageal reflux disease without esophagitis: Secondary | ICD-10-CM | POA: Diagnosis not present

## 2023-04-07 DIAGNOSIS — J309 Allergic rhinitis, unspecified: Secondary | ICD-10-CM | POA: Diagnosis not present

## 2023-10-05 ENCOUNTER — Telehealth: Payer: Self-pay | Admitting: Hematology and Oncology

## 2023-10-05 NOTE — Telephone Encounter (Signed)
 [place] left a voicemail, I returned Janice Nguyen's call to confirm her appointment still stands for 6/23.

## 2023-10-18 ENCOUNTER — Inpatient Hospital Stay: Attending: Hematology and Oncology

## 2023-10-18 ENCOUNTER — Inpatient Hospital Stay: Admitting: Hematology and Oncology

## 2023-10-18 ENCOUNTER — Encounter: Payer: Self-pay | Admitting: Hematology and Oncology

## 2023-10-18 VITALS — BP 175/66 | HR 68 | Temp 99.7°F | Resp 18 | Wt 183.6 lb

## 2023-10-18 DIAGNOSIS — Z87891 Personal history of nicotine dependence: Secondary | ICD-10-CM

## 2023-10-18 DIAGNOSIS — D696 Thrombocytopenia, unspecified: Secondary | ICD-10-CM | POA: Insufficient documentation

## 2023-10-18 LAB — CBC WITH DIFFERENTIAL (CANCER CENTER ONLY)
Abs Immature Granulocytes: 0.01 10*3/uL (ref 0.00–0.07)
Basophils Absolute: 0 10*3/uL (ref 0.0–0.1)
Basophils Relative: 1 %
Eosinophils Absolute: 0.1 10*3/uL (ref 0.0–0.5)
Eosinophils Relative: 2 %
HCT: 37.4 % (ref 36.0–46.0)
Hemoglobin: 12.4 g/dL (ref 12.0–15.0)
Immature Granulocytes: 0 %
Lymphocytes Relative: 16 %
Lymphs Abs: 0.8 10*3/uL (ref 0.7–4.0)
MCH: 29.6 pg (ref 26.0–34.0)
MCHC: 33.2 g/dL (ref 30.0–36.0)
MCV: 89.3 fL (ref 80.0–100.0)
Monocytes Absolute: 0.6 10*3/uL (ref 0.1–1.0)
Monocytes Relative: 11 %
Neutro Abs: 3.5 10*3/uL (ref 1.7–7.7)
Neutrophils Relative %: 70 %
Platelet Count: 89 10*3/uL — ABNORMAL LOW (ref 150–400)
RBC: 4.19 MIL/uL (ref 3.87–5.11)
RDW: 13.1 % (ref 11.5–15.5)
Smear Review: NORMAL
WBC Count: 5 10*3/uL (ref 4.0–10.5)
nRBC: 0 % (ref 0.0–0.2)

## 2023-10-18 LAB — CMP (CANCER CENTER ONLY)
ALT: 16 U/L (ref 0–44)
AST: 14 U/L — ABNORMAL LOW (ref 15–41)
Albumin: 4 g/dL (ref 3.5–5.0)
Alkaline Phosphatase: 79 U/L (ref 38–126)
Anion gap: 5 (ref 5–15)
BUN: 12 mg/dL (ref 8–23)
CO2: 29 mmol/L (ref 22–32)
Calcium: 9.1 mg/dL (ref 8.9–10.3)
Chloride: 108 mmol/L (ref 98–111)
Creatinine: 1.02 mg/dL — ABNORMAL HIGH (ref 0.44–1.00)
GFR, Estimated: 60 mL/min — ABNORMAL LOW (ref >60)
Glucose, Bld: 119 mg/dL — ABNORMAL HIGH (ref 70–99)
Potassium: 3.6 mmol/L (ref 3.5–5.1)
Sodium: 142 mmol/L (ref 135–145)
Total Bilirubin: 1.5 mg/dL — ABNORMAL HIGH (ref 0.0–1.2)
Total Protein: 6.5 g/dL (ref 6.5–8.1)

## 2023-10-18 LAB — HEPATITIS C ANTIBODY: HCV Ab: NONREACTIVE

## 2023-10-18 LAB — HIV ANTIBODY (ROUTINE TESTING W REFLEX): HIV Screen 4th Generation wRfx: NONREACTIVE

## 2023-10-18 LAB — VITAMIN B12: Vitamin B-12: 322 pg/mL (ref 180–914)

## 2023-10-18 LAB — SEDIMENTATION RATE: Sed Rate: 29 mm/h — ABNORMAL HIGH (ref 0–22)

## 2023-10-18 NOTE — Assessment & Plan Note (Signed)
 The cause of her thrombocytopenia is unknown We repeated her CBC today and confirm diagnosis of thrombocytopenia Further workup in progress I will call the patient with test results and of the week If the cause of her thrombocytopenia is unrevealing, the next step would be to order  CT imaging and she agreed with the plan of care

## 2023-10-18 NOTE — Progress Notes (Signed)
 [facility] CONSULT NOTE  Patient Care Team: Pcp, No as PCP - General  ASSESSMENT & PLAN Thrombocytopenia (HCC) The cause of her thrombocytopenia is unknown We repeated her CBC today and confirm diagnosis of thrombocytopenia Further workup in progress I will call the patient with test results and of the week If the cause of her thrombocytopenia is unrevealing, the next step would be to order  CT imaging and she agreed with the plan of care  Orders Placed This Encounter  Procedures   CMP (Cancer Center only)    Standing Status:   Future    Number of Occurrences:   1    Expiration Date:   10/17/2024   CBC with Differential (Cancer Center Only)    Standing Status:   Future    Number of Occurrences:   1    Expiration Date:   10/17/2024   Sedimentation rate    Standing Status:   Future    Number of Occurrences:   1    Expiration Date:   10/17/2024   Vitamin B12    Standing Status:   Future    Number of Occurrences:   1    Expiration Date:   10/17/2024   ANA, IFA (with reflex)    Standing Status:   Future    Number of Occurrences:   1    Expiration Date:   10/17/2024   HIV antibody (with reflex)    Standing Status:   Future    Number of Occurrences:   1    Expiration Date:   10/17/2024   Hepatitis C antibody    Standing Status:   Future    Number of Occurrences:   1    Expiration Date:   10/17/2024   All questions were answered. The patient knows to call the clinic with any problems, questions or concerns. No barriers to learning was detected. The total time spent in the appointment was 60 minutes encounter with patients including review of chart and various tests results, discussions about plan of care and coordination of care plan  Almarie Bedford, MD 10/18/2023 10:51 AM  CHIEF COMPLAINTS/PURPOSE OF CONSULTATION:  Thrombocytopenia  HISTORY OF PRESENTING ILLNESS:  Janice Nguyen 68 y.o. female is here because of thrombocytopenia.  She was found to have abnormal  CBC from recent blood work I do not have her historical record According to her labs from Sep 25, 2023, her platelet count was 107 She denies recent bruising/bleeding, such as spontaneous epistaxis, hematuria, melena or hematochezia The patient denies history of liver disease, exposure to heparin, history of cardiac murmur/prior cardiovascular surgery or recent new medications She denies prior blood or platelet transfusions Most recently, she developed right lower quadrant/groin pain that was severe She was given some steroid medication and muscle relaxant and that pain has resolved  MEDICAL HISTORY:  Past Medical History:  Diagnosis Date   Chronic kidney disease    Hyperlipidemia    Hypertension     SURGICAL HISTORY: Past Surgical History:  Procedure Laterality Date   DILATION AND CURETTAGE OF UTERUS     RIGHT OOPHORECTOMY Right     SOCIAL HISTORY: Social History   Socioeconomic History   Marital status: Widowed    Spouse name: Not on file   Number of children: 1   Years of education: Not on file   Highest education level: Not on file  Occupational History   Not on file  Tobacco Use   Smoking status: Former  Current packs/day: 0.00    Types: Cigarettes    Quit date: 2004    Years since quitting: 21.4   Smokeless tobacco: Never  Substance and Sexual Activity   Alcohol use: Yes    Alcohol/week: 3.0 - 4.0 standard drinks of alcohol    Types: 3 - 4 Shots of liquor per week   Drug use: Not on file   Sexual activity: Not on file  Other Topics Concern   Not on file  Social History Narrative   Not on file   Social Drivers of Health   Financial Resource Strain: Not on file  Food Insecurity: Not on file  Transportation Needs: Not on file  Physical Activity: Not on file  Stress: Not on file  Social Connections: Not on file  Intimate Partner Violence: Not on file    FAMILY HISTORY: History reviewed. No pertinent family history.  ALLERGIES:  has no allergies  on file.  MEDICATIONS:  Current Outpatient Medications  Medication Sig Dispense Refill   escitalopram (LEXAPRO) 20 MG tablet Take 20 mg by mouth daily.     famotidine (PEPCID) 20 MG tablet Take 20 mg by mouth 2 (two) times daily.     loratadine (CLARITIN) 10 MG tablet Take 10 mg by mouth every other day.     atorvastatin (LIPITOR) 40 MG tablet Take 40 mg by mouth daily.     Calcium Carb-Cholecalciferol (CALCIUM 500+D3) 500-10 MG-MCG TABS Take 1 tablet by mouth daily.     Cholecalciferol 50 MCG (2000 UT) CAPS Take 2 capsules by mouth daily.     guaiFENesin (MUCINEX) 600 MG 12 hr tablet Take 600 mg by mouth as needed.     melatonin 5 MG TABS Take 10 mg by mouth at bedtime.     Omega-3 Fatty Acids (FISH OIL) 300 MG CAPS Take 300 mg by mouth daily.     Probiotic TBEC Take 1 capsule by mouth daily.     No current facility-administered medications for this visit.    REVIEW OF SYSTEMS:   Constitutional: Denies fevers, chills or abnormal night sweats Eyes: Denies blurriness of vision, double vision or watery eyes Ears, nose, mouth, throat, and face: Denies mucositis or sore throat Respiratory: Denies cough, dyspnea or wheezes Cardiovascular: Denies palpitation, chest discomfort or lower extremity swelling Gastrointestinal:  Denies nausea, heartburn or change in bowel habits Skin: Denies abnormal skin rashes Lymphatics: Denies new lymphadenopathy or easy bruising Neurological:Denies numbness, tingling or new weaknesses Behavioral/Psych: Mood is stable, no new changes  All other systems were reviewed with the patient and are negative.  PHYSICAL EXAMINATION: ECOG PERFORMANCE STATUS: 0 - Asymptomatic  Vitals:   10/18/23 0944  BP: (!) 175/66  Pulse: 68  Resp: 18  Temp: 99.7 F (37.6 C)  SpO2: 98%   Filed Weights   10/18/23 0944  Weight: 183 lb 9.6 oz (83.3 kg)    GENERAL:alert, no distress and comfortable SKIN: skin color, texture, turgor are normal, no rashes or significant  lesions EYES: normal, conjunctiva are pink and non-injected, sclera clear OROPHARYNX:no exudate, no erythema and lips, buccal mucosa, and tongue normal  NECK: supple, thyroid  normal size, non-tender, without nodularity LYMPH:  no palpable lymphadenopathy in the cervical, axillary or inguinal LUNGS: clear to auscultation and percussion with normal breathing effort HEART: regular rate & rhythm and no murmurs and no lower extremity edema ABDOMEN:abdomen soft, non-tender and normal bowel sounds Musculoskeletal:no cyanosis of digits and no clubbing  PSYCH: alert & oriented x 3 with fluent speech NEURO:  no focal motor/sensory deficits  LABORATORY DATA:  I have reviewed the data as listed Lab Results  Component Value Date   WBC 5.0 10/18/2023   HGB 12.4 10/18/2023   HCT 37.4 10/18/2023   MCV 89.3 10/18/2023   PLT 89 (L) 10/18/2023

## 2023-10-22 LAB — ANTINUCLEAR ANTIBODIES, IFA: ANA Ab, IFA: NEGATIVE

## 2023-10-25 ENCOUNTER — Ambulatory Visit: Payer: Self-pay | Admitting: Hematology and Oncology

## 2023-10-25 ENCOUNTER — Telehealth: Payer: Self-pay

## 2023-10-25 DIAGNOSIS — D696 Thrombocytopenia, unspecified: Secondary | ICD-10-CM

## 2023-10-25 NOTE — Telephone Encounter (Signed)
 Called and given appt with Dr. Lonn on 7/17. She is aware of appt.

## 2023-10-25 NOTE — Telephone Encounter (Signed)
 Called and given below message. She verbalized understanding. Sent mychart message with phone # for radiology scheduling to call to schedule CT per her request. She will call the office back to schedule appt with Dr. Lonn.

## 2023-10-25 NOTE — Progress Notes (Signed)
 Pls call her, all labs are normal, not helpful to evaluate for low platelet. Next step is CT as discussed, give her number to call and schedule for next week on 7/7 (order is in), and schedule follow up with me on 7/11 or following week, for 20 mins appt

## 2023-11-04 ENCOUNTER — Ambulatory Visit (HOSPITAL_COMMUNITY)
Admission: RE | Admit: 2023-11-04 | Discharge: 2023-11-04 | Disposition: A | Discharge status: Home / Self Care | Source: Ambulatory Visit | Attending: Hematology and Oncology | Admitting: Hematology and Oncology

## 2023-11-04 DIAGNOSIS — D696 Thrombocytopenia, unspecified: Secondary | ICD-10-CM | POA: Insufficient documentation

## 2023-11-04 DIAGNOSIS — Z0389 Encounter for observation for other suspected diseases and conditions ruled out: Secondary | ICD-10-CM | POA: Insufficient documentation

## 2023-11-04 DIAGNOSIS — K5730 Diverticulosis of large intestine without perforation or abscess without bleeding: Secondary | ICD-10-CM | POA: Insufficient documentation

## 2023-11-04 DIAGNOSIS — Z1289 Encounter for screening for malignant neoplasm of other sites: Secondary | ICD-10-CM | POA: Diagnosis present

## 2023-11-04 MED ORDER — IOHEXOL 300 MG/ML  SOLN
100.0000 mL | Freq: Once | INTRAMUSCULAR | Status: AC | PRN
  Administered 2023-11-04: 100 mL via INTRAVENOUS

## 2023-11-11 ENCOUNTER — Inpatient Hospital Stay: Attending: Hematology and Oncology | Admitting: Hematology and Oncology

## 2023-11-11 ENCOUNTER — Encounter: Payer: Self-pay | Admitting: Hematology and Oncology

## 2023-11-11 VITALS — BP 182/68 | HR 68 | Temp 98.7°F | Resp 18 | Ht 65.0 in | Wt 184.0 lb

## 2023-11-11 DIAGNOSIS — Z6830 Body mass index (BMI) 30.0-30.9, adult: Secondary | ICD-10-CM | POA: Diagnosis not present

## 2023-11-11 DIAGNOSIS — E66811 Obesity, class 1: Secondary | ICD-10-CM | POA: Diagnosis not present

## 2023-11-11 DIAGNOSIS — D696 Thrombocytopenia, unspecified: Secondary | ICD-10-CM | POA: Diagnosis not present

## 2023-11-11 NOTE — Assessment & Plan Note (Addendum)
 Suspect the cause of her chronic thrombocytopenia is due to liver disease Even though not mentioned in the CT report, I can see mild scalloping of her liver I suspect the cause would be related to known alcoholic steatohepatitis causing mild thrombocytopenia Her spleen is not enlarged She does not need further workup or follow-up I plan to see her again next year She has appointment to see her primary care doctor with repeat labs in 6 months and she will call me with test results

## 2023-11-11 NOTE — Progress Notes (Signed)
 [facility] OFFICE PROGRESS NOTE  Patient Care Team: Alben Therisa MATSU, PA as PCP - General (Family Medicine)  Assessment & Plan Thrombocytopenia [facility]) Suspect the cause of her chronic thrombocytopenia is due to liver disease Even though not mentioned in the CT report, I can see mild scalloping of her liver I suspect the cause would be related to known alcoholic steatohepatitis causing mild thrombocytopenia Her spleen is not enlarged She does not need further workup or follow-up I plan to see her again next year She has appointment to see her primary care doctor with repeat labs in 6 months and she will call me with test results Obesity, Class I, BMI 30-34.9 She has class I obesity She has borderline abnormal liver enzymes and CT imaging showed possible early stage liver cirrhosis We discussed importance of dietary modification and omission of alcohol intake I also encouraged the patient to pursue weight loss strategies  Orders Placed This Encounter  Procedures   CMP (Cancer Center only)    Standing Status:   Future    Expiration Date:   11/10/2024   CBC with Differential (Cancer Center Only)    Standing Status:   Future    Expiration Date:   11/10/2024     Almarie Bedford, MD  INTERVAL HISTORY: she returns for surveillance follow-up to review test results She was referred to see me recently for thrombocytopenia I reviewed multiple labs and CT imaging with the patient  PHYSICAL EXAMINATION: ECOG PERFORMANCE STATUS: 0 - Asymptomatic  Vitals:   11/11/23 1057  BP: (!) 182/68  Pulse: 68  Resp: 18  Temp: 98.7 F (37.1 C)  SpO2: 99%   Filed Weights   11/11/23 1057  Weight: 184 lb (83.5 kg)    Relevant data reviewed during this visit included all lab results and CT imaging from July 2025

## 2023-11-11 NOTE — Assessment & Plan Note (Addendum)
 She has class I obesity She has borderline abnormal liver enzymes and CT imaging showed possible early stage liver cirrhosis We discussed importance of dietary modification and omission of alcohol intake I also encouraged the patient to pursue weight loss strategies

## 2023-11-12 ENCOUNTER — Telehealth: Payer: Self-pay | Admitting: Hematology and Oncology

## 2023-11-12 NOTE — Telephone Encounter (Signed)
 Left patient a vm regarding upcoming appointment

## 2024-04-25 ENCOUNTER — Telehealth: Payer: Self-pay | Admitting: *Deleted

## 2024-04-25 NOTE — Telephone Encounter (Signed)
 Patient Returned Call Regarding PCP Appointment  Patient returned the call about her upcoming PCP appointment. She stated that the appointment is scheduled for 1/21 and she will have her labs completed at that visit.     Contacted patient per Dr. Buzz request -patient said she would have labs with PCP this month. Call and ask if she had labs done? If so please request a copy  LVM on named VM: Please contact office about her labs. If she had them at PCP we can request results if she did not ask them to send this office the lab results, or to let us  know they weren't done.

## 2024-11-14 ENCOUNTER — Other Ambulatory Visit

## 2024-11-14 ENCOUNTER — Ambulatory Visit: Admitting: Hematology and Oncology
